# Patient Record
Sex: Male | Born: 1960 | Race: White | Hispanic: No | Marital: Single | Smoking: Never smoker
Health system: Southern US, Community
[De-identification: ages and names within clinical notes are randomized; demographics above are authoritative.]

## PROBLEM LIST (undated history)

## (undated) DIAGNOSIS — H409 Unspecified glaucoma: Secondary | ICD-10-CM

## (undated) DIAGNOSIS — I10 Essential (primary) hypertension: Secondary | ICD-10-CM

## (undated) DIAGNOSIS — E878 Other disorders of electrolyte and fluid balance, not elsewhere classified: Secondary | ICD-10-CM

## (undated) DIAGNOSIS — H5440 Blindness, one eye, unspecified eye: Secondary | ICD-10-CM

## (undated) DIAGNOSIS — K219 Gastro-esophageal reflux disease without esophagitis: Secondary | ICD-10-CM

## (undated) HISTORY — PX: HERNIA MESH REMOVAL: SHX1745

## (undated) HISTORY — PX: RETINAL LASER PROCEDURE: SHX2339

## (undated) HISTORY — PX: ROTATOR CUFF REPAIR: SHX139

---

## 2009-07-07 ENCOUNTER — Emergency Department (HOSPITAL_COMMUNITY): Admission: EM | Admit: 2009-07-07 | Discharge: 2009-07-07 | Payer: Self-pay | Admitting: Emergency Medicine

## 2009-07-20 ENCOUNTER — Emergency Department (HOSPITAL_COMMUNITY): Admission: EM | Admit: 2009-07-20 | Discharge: 2009-07-20 | Payer: Self-pay | Admitting: Emergency Medicine

## 2010-05-02 LAB — URINALYSIS, ROUTINE W REFLEX MICROSCOPIC
Glucose, UA: NEGATIVE mg/dL
Hgb urine dipstick: NEGATIVE
Ketones, ur: NEGATIVE mg/dL
Nitrite: NEGATIVE
Protein, ur: NEGATIVE mg/dL
Urobilinogen, UA: 0.2 mg/dL (ref 0.0–1.0)
pH: 6 (ref 5.0–8.0)

## 2010-05-02 LAB — COMPREHENSIVE METABOLIC PANEL
AST: 29 U/L (ref 0–37)
Albumin: 3.8 g/dL (ref 3.5–5.2)
Alkaline Phosphatase: 68 U/L (ref 39–117)
BUN: 17 mg/dL (ref 6–23)
Calcium: 9.3 mg/dL (ref 8.4–10.5)
Chloride: 104 mEq/L (ref 96–112)
Glucose, Bld: 99 mg/dL (ref 70–99)
Potassium: 4.2 mEq/L (ref 3.5–5.1)
Total Bilirubin: 0.5 mg/dL (ref 0.3–1.2)

## 2010-05-02 LAB — CBC
HCT: 41.1 % (ref 39.0–52.0)
MCHC: 34.4 g/dL (ref 30.0–36.0)
Platelets: 175 10*3/uL (ref 150–400)
RBC: 4.61 MIL/uL (ref 4.22–5.81)
WBC: 10.2 10*3/uL (ref 4.0–10.5)

## 2010-05-02 LAB — LIPASE, BLOOD: Lipase: 32 U/L (ref 11–59)

## 2010-05-02 LAB — DIFFERENTIAL
Basophils Relative: 1 % (ref 0–1)
Monocytes Relative: 5 % (ref 3–12)
Neutrophils Relative %: 70 % (ref 43–77)

## 2011-03-10 IMAGING — CT CT ABD-PELV W/ CM
2 of 5 series · 16 of 46 positions shown, 18 images · IV contrast (Omnipaque 300)
Comparison: None.

CLINICAL DATA: Abdominal pain, umbilical hernia

CT ABDOMEN AND PELVIS WITH CONTRAST
TECHNIQUE: Multidetector CT imaging of the abdomen and pelvis was
performed following the standard protocol during bolus
administration of intravenous contrast.
Contrast: 100 ml of Hmnipaque-XOO

[Series 2: abd_pel_with 5.0 b40f · axial · 0.86mm/px · z∈[-492,-92]mm · 13 of 92 slices shown, 15 images]
[im 6/92  soft-tissue]
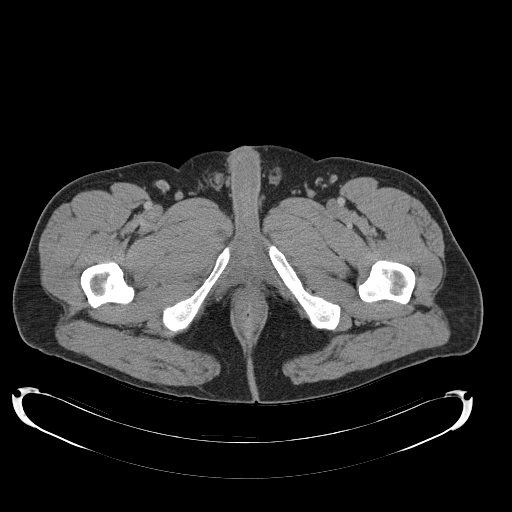
[im 6/92  bone]
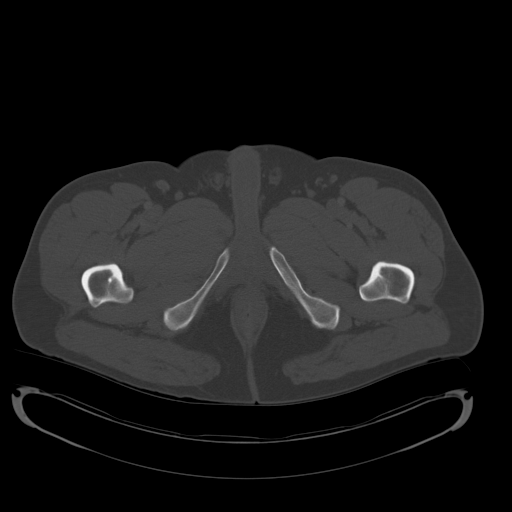
[im 11/92  soft-tissue]
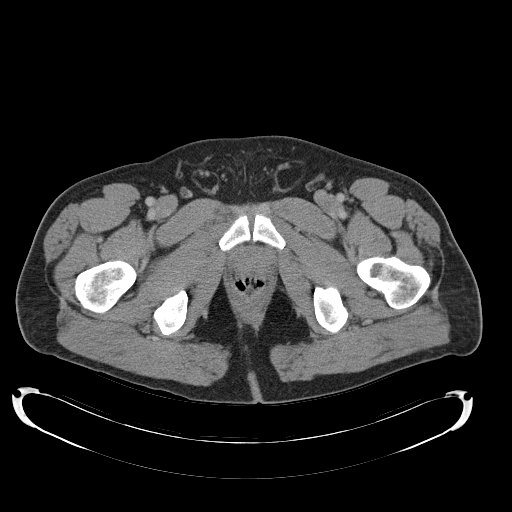
[im 21/92  soft-tissue]
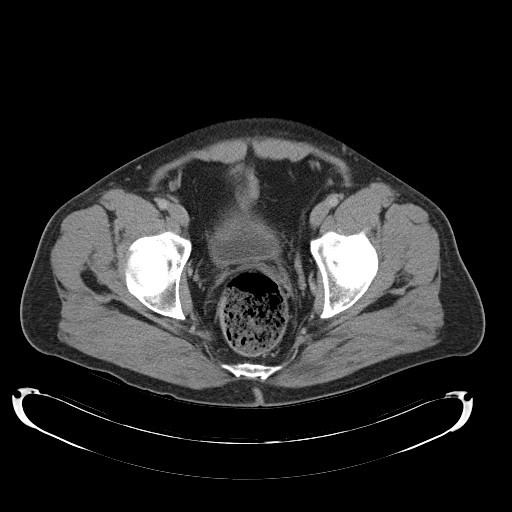
[im 26/92  soft-tissue]
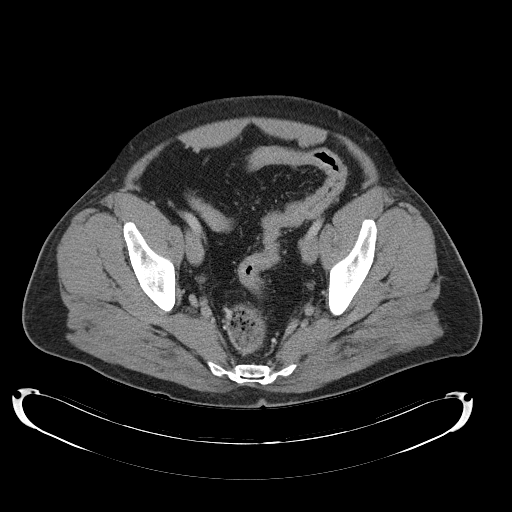
[im 31/92  soft-tissue]
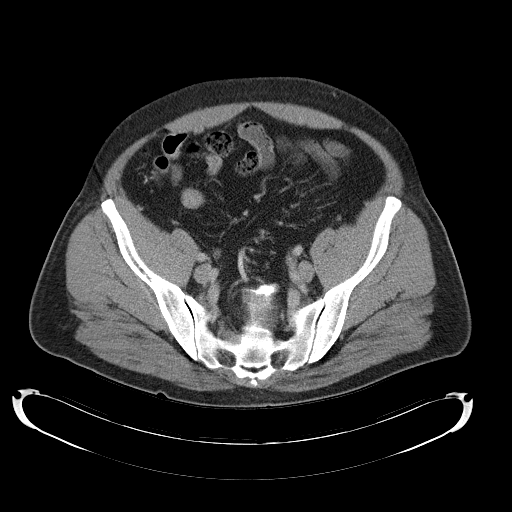
[im 41/92  soft-tissue]
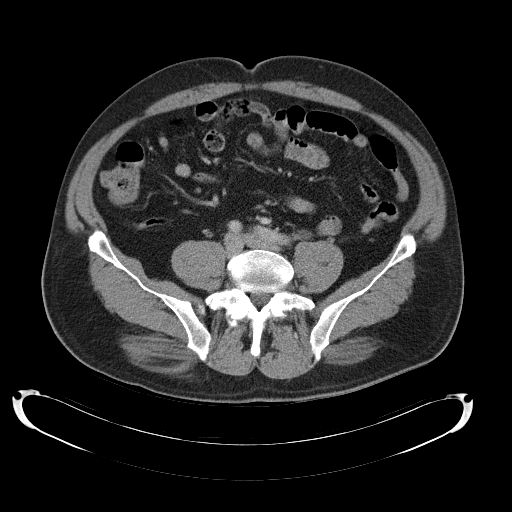
[im 46/92  soft-tissue]
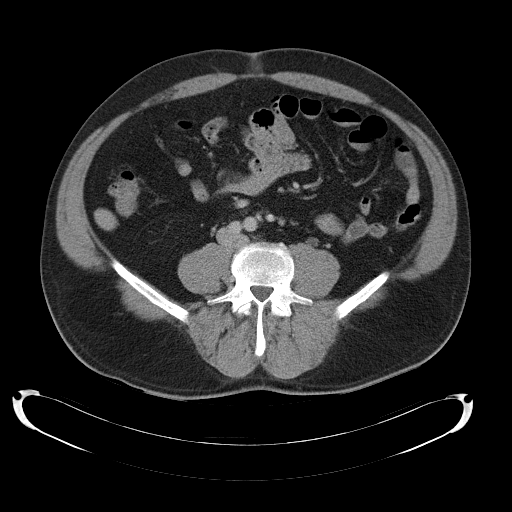
[im 51/92  soft-tissue]
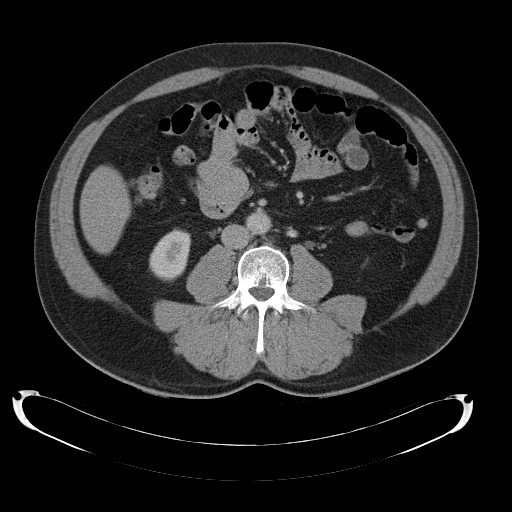
[im 61/92  soft-tissue]
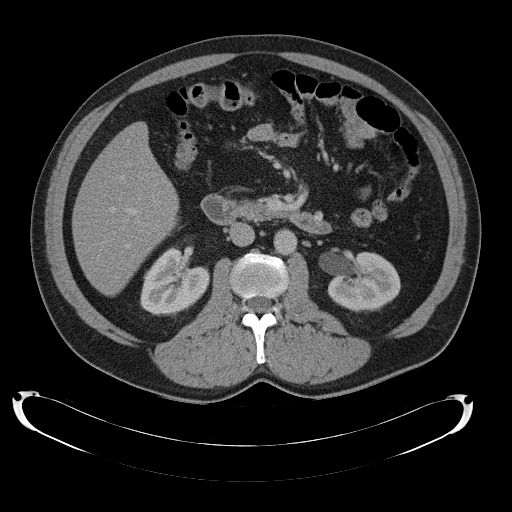
[im 61/92  bone]
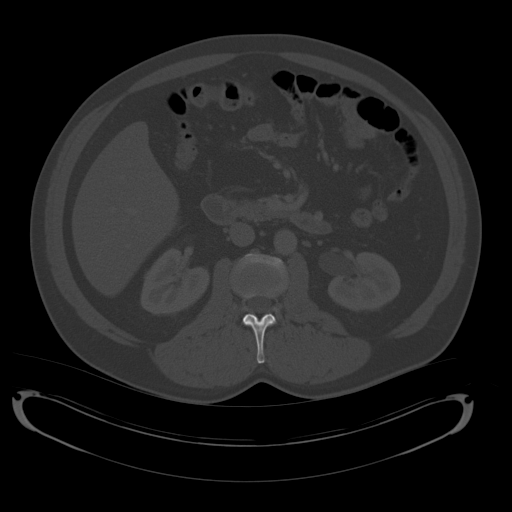
[im 66/92  soft-tissue]
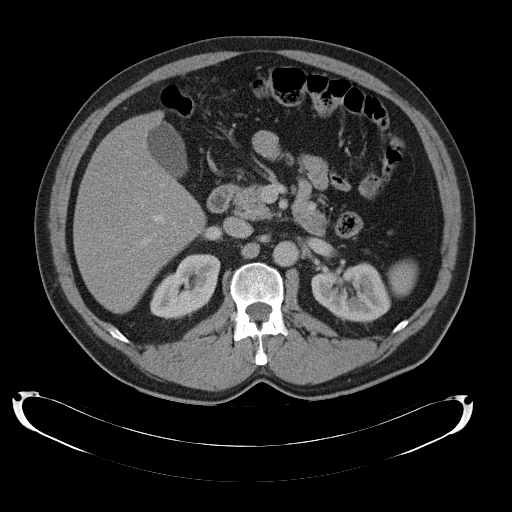
[im 71/92  soft-tissue]
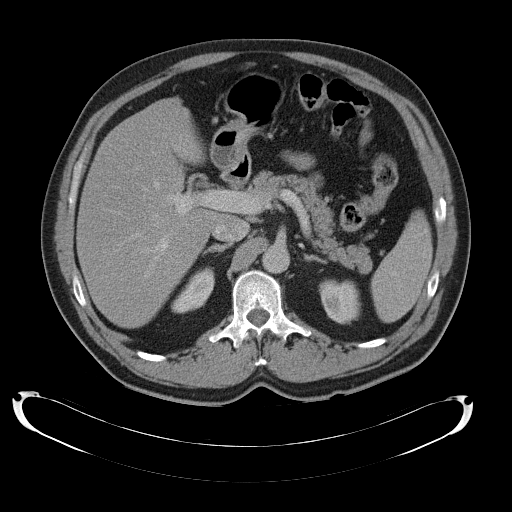
[im 81/92  soft-tissue]
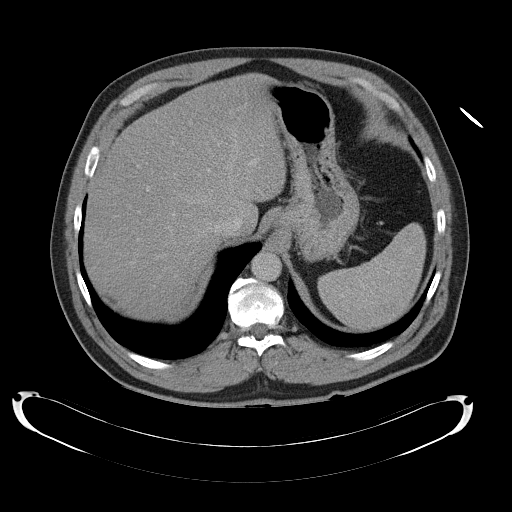
[im 86/92  soft-tissue]
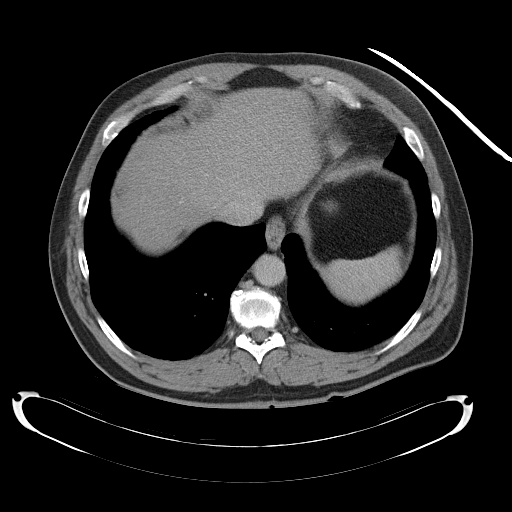

[Series 4: mpr cor post contrast (id) · coronal · 0.74mm/px · 3 of 104 slices shown]
[im 35/104  soft-tissue]
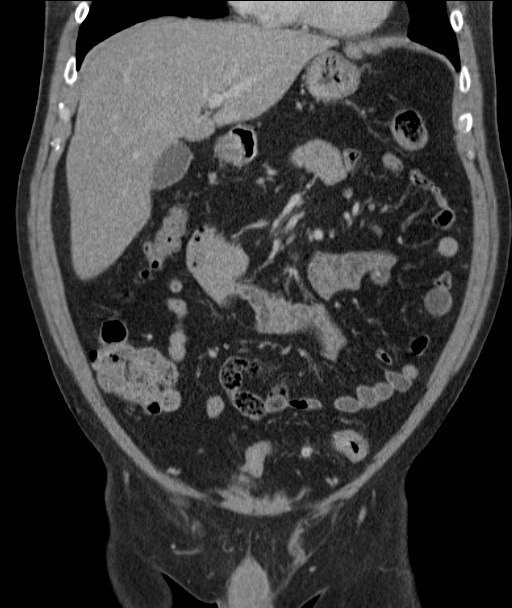
[im 46/104  soft-tissue]
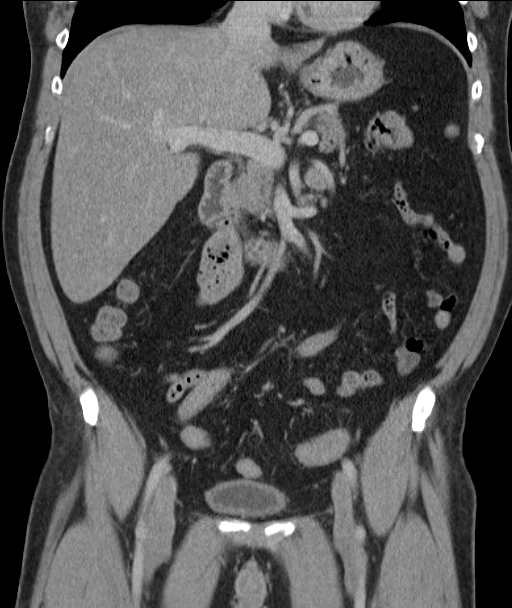
[im 58/104  soft-tissue]
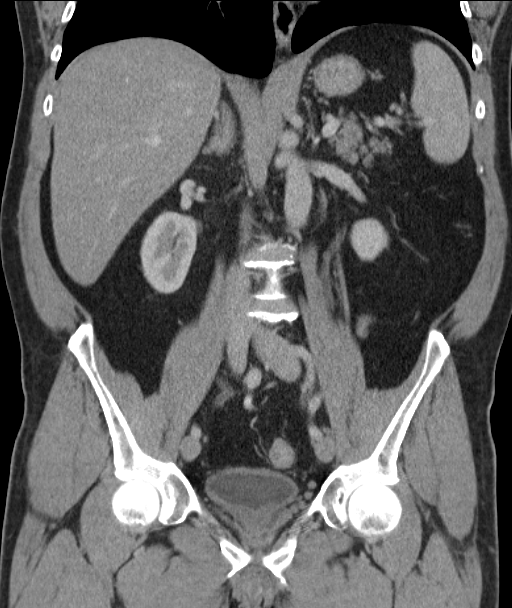

[16 of 46 positions shown; findings below may reference images not displayed]

FINDINGS: Lung bases are unremarkable.
Enhanced liver, spleen, pancreas and adrenals are unremarkable.  A
small hiatal hernia is noted. Enhanced kidney are symmetrical in
size.  Minimal prominent left extrarenal collecting system without
frank hydronephrosis.  No focal renal mass.  No aortic aneurysm.
No bowel obstruction.  No ascites or free air.  No adenopathy.
Normal appendix is clearly visualized in axial image 53.  There is
a small umbilical region hernia containing fat measures about 1.5 x
2.1 cm without evidence of acute complication.

No pelvic ascites or adenopathy.  No pericecal inflammation.
Moderate stool noted within the rectum.  The rectum measures 6.6 cm
in diameter.  Nonspecific mild thickening of urinary bladder wall.
The prostate gland is unremarkable.  Bilateral small inguinal
scrotal canal hernia containing fat without evidence of acute
complication.  No destructive bony lesions are noted.  Sagittal
images of the spine shows mild degenerative changes.
IMPRESSION: 1.  There is a small umbilical region ventral hernia containing fat
measures 1.5 x 2.1 cm without evidence of acute complication.
2.  Normal appendix.
3.  No hydronephrosis or hydroureter.
4.  Moderate stool noted within rectum.  The rectum measures at
least 6.6 cm in diameter.
5.  Nonspecific mild thickening of urinary bladder wall.

## 2013-08-12 ENCOUNTER — Emergency Department (HOSPITAL_COMMUNITY): Payer: Self-pay

## 2013-08-12 ENCOUNTER — Emergency Department (HOSPITAL_COMMUNITY)
Admission: EM | Admit: 2013-08-12 | Discharge: 2013-08-12 | Disposition: A | Discharge status: Home / Self Care | Payer: Self-pay | Attending: Emergency Medicine | Admitting: Emergency Medicine

## 2013-08-12 ENCOUNTER — Encounter (HOSPITAL_COMMUNITY): Payer: Self-pay | Admitting: Emergency Medicine

## 2013-08-12 DIAGNOSIS — I10 Essential (primary) hypertension: Secondary | ICD-10-CM | POA: Insufficient documentation

## 2013-08-12 DIAGNOSIS — Z880 Allergy status to penicillin: Secondary | ICD-10-CM | POA: Insufficient documentation

## 2013-08-12 DIAGNOSIS — M109 Gout, unspecified: Secondary | ICD-10-CM | POA: Insufficient documentation

## 2013-08-12 DIAGNOSIS — Z8719 Personal history of other diseases of the digestive system: Secondary | ICD-10-CM | POA: Insufficient documentation

## 2013-08-12 DIAGNOSIS — Z8669 Personal history of other diseases of the nervous system and sense organs: Secondary | ICD-10-CM | POA: Insufficient documentation

## 2013-08-12 HISTORY — DX: Unspecified glaucoma: H40.9

## 2013-08-12 HISTORY — DX: Blindness, one eye, unspecified eye: H54.40

## 2013-08-12 HISTORY — DX: Essential (primary) hypertension: I10

## 2013-08-12 HISTORY — DX: Gastro-esophageal reflux disease without esophagitis: K21.9

## 2013-08-12 HISTORY — DX: Other disorders of electrolyte and fluid balance, not elsewhere classified: E87.8

## 2013-08-12 LAB — URIC ACID: URIC ACID, SERUM: 9 mg/dL — AB (ref 4.0–7.8)

## 2013-08-12 MED ORDER — INDOMETHACIN 25 MG PO CAPS: ORAL_CAPSULE | ORAL | Status: AC

## 2013-08-12 MED ORDER — HYDROCODONE-ACETAMINOPHEN 7.5-325 MG PO TABS: 1.0000 | ORAL_TABLET | ORAL | Status: AC | PRN

## 2013-08-12 MED ORDER — DEXAMETHASONE 4 MG PO TABS: ORAL_TABLET | ORAL | Status: AC

## 2013-08-12 NOTE — ED Provider Notes (Signed)
Medical screening examination/treatment/procedure(s) were performed by non-physician practitioner and as supervising physician I was immediately available for consultation/collaboration.   EKG Interpretation None        Donald W Wickline, MD 08/12/13 1526 

## 2013-08-12 NOTE — Discharge Instructions (Signed)
The x-ray of your toe is negative for fracture, or foreign body in the tissue. Your uric acid however is elevated. Please elevate your foot and keep your foot warm today. Please use Indocin and Decadron daily. Use Norco every 4 hours if needed for pain. Norco may cause drowsiness, please use with caution. Please see your primary doctor for additional evaluation and management.

## 2013-08-12 NOTE — ED Notes (Signed)
Pt c/o pain, swelling to joint of right great toe, denies any injury,

## 2013-08-12 NOTE — ED Notes (Addendum)
Pt woke up this am with pain in right foot; right lateral aspect of first toe, area is red and warm to touch. Denies injury.

## 2013-08-12 NOTE — ED Provider Notes (Signed)
CSN: [id number]     Arrival date & time 08/12/13  [id number] History   First MD Initiated Contact with Patient 08/12/13 [phone]     Chief Complaint  Patient presents with  . Foot Injury     (Consider location/radiation/quality/duration/timing/severity/associated sxs/prior Treatment) HPI Comments: Pt states this AM about 2:30 he had severe pain of the right first toe. He was able to wear boots on yesterday and walk around without problem. He does not recall any injury or trauma to the toe. No previous operations or procedures  On the right foot in the past. No previous hx of this problem. Pain still present.  The history is provided by the patient.    Past Medical History  Diagnosis Date  . Hypertension   . Hyperchloremia   . GERD (gastroesophageal reflux disease)   . 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 Glaucoma    Past Surgical History  Procedure Laterality Date  . Rotator cuff repair      bilaterally  . Retinal laser procedure    . Hernia mesh removal     History reviewed. No pertinent family history. History  Substance Use Topics  . Smoking status: Never Smoker   . Smokeless tobacco: Not on file  . Alcohol Use: No    Review of Systems    Allergies  Penicillins  Home Medications   Prior to Admission medications   Not on File   BP 153/91  Pulse 58  Temp(Src) 98 F (36.7 C) (Oral)  Ht 5\' 7"  (1.702 m)  Wt 242 lb (109.77 kg)  BMI 37.89 kg/m2  SpO2 95% Physical Exam  Nursing note and vitals reviewed. Constitutional: He is oriented to person, place, and time. He appears well-developed and well-nourished.  Non-toxic appearance.  HENT:  Head: Normocephalic.  Right Ear: Tympanic membrane and external ear normal.  Left Ear: Tympanic membrane and external ear normal.  Eyes: EOM and lids are normal. Pupils are equal, round, and reactive to light.  Neck: Normal range of motion. Neck supple. Carotid bruit is not present.  Cardiovascular: Normal rate, regular  rhythm, normal heart sounds, intact distal pulses and normal pulses.   Pulmonary/Chest: Breath sounds normal. No respiratory distress.  Abdominal: Soft. Bowel sounds are normal. There is no tenderness. There is no guarding.  Musculoskeletal: Normal range of motion.  There is pain and mild swelling of the right first toe, particularly at the MP joint area. No visible tophi appreciated. There no red streaks appreciated. There no lesions between the toes of the right foot. The dorsalis pedis pulses 2+. Capillary refill is less than 2 seconds. The Achilles tendon is intact. There no puncture marks at the plantar surface.  Lymphadenopathy:       Head (right side): No submandibular adenopathy present.       Head (left side): No submandibular adenopathy present.    He has no cervical adenopathy.  Neurological: He is alert and oriented to person, place, and time. He has normal strength. No cranial nerve deficit or sensory deficit.  Skin: Skin is warm and dry.  Psychiatric: He has a normal mood and affect. His speech is normal.    ED Course  Procedures (including critical care time) Labs Review Labs Reviewed - No data to display  Imaging Review No results found.   EKG Interpretation None      MDM X-ray of the right first toe is negative for fracture or foreign body. The uric acid  is elevated at 9. The patient is given these results. I have given the patient a handout on foods high in purines. Prescription for Decadron, Norco, and Indocin given to the patient. Patient is to follow up with his primary physician.    Final diagnoses:  Acute gout of right foot, unspecified cause    *I have reviewed nursing notes, vital signs, and all appropriate lab and imaging results for this patient.Kathie Dike**    Hobson M Bryant, PA-C 08/12/13 1043
